# Patient Record
Sex: Female | Born: 1970 | Race: White | Hispanic: No | Smoking: Current every day smoker
Health system: Southern US, Community
[De-identification: ages and names within clinical notes are randomized; demographics above are authoritative.]

---

## 2007-10-06 ENCOUNTER — Ambulatory Visit: Payer: Self-pay

## 2010-12-20 ENCOUNTER — Ambulatory Visit: Payer: Self-pay

## 2016-05-01 ENCOUNTER — Emergency Department: Payer: Self-pay

## 2016-05-01 ENCOUNTER — Emergency Department
Admission: EM | Admit: 2016-05-01 | Discharge: 2016-05-01 | Disposition: A | Discharge status: Home / Self Care | Payer: Self-pay | Attending: Student | Admitting: Student

## 2016-05-01 DIAGNOSIS — Y9302 Activity, running: Secondary | ICD-10-CM | POA: Insufficient documentation

## 2016-05-01 DIAGNOSIS — W228XXA Striking against or struck by other objects, initial encounter: Secondary | ICD-10-CM | POA: Insufficient documentation

## 2016-05-01 DIAGNOSIS — S92511A Displaced fracture of proximal phalanx of right lesser toe(s), initial encounter for closed fracture: Secondary | ICD-10-CM | POA: Insufficient documentation

## 2016-05-01 DIAGNOSIS — F17210 Nicotine dependence, cigarettes, uncomplicated: Secondary | ICD-10-CM | POA: Insufficient documentation

## 2016-05-01 DIAGNOSIS — Y999 Unspecified external cause status: Secondary | ICD-10-CM | POA: Insufficient documentation

## 2016-05-01 DIAGNOSIS — Y929 Unspecified place or not applicable: Secondary | ICD-10-CM | POA: Insufficient documentation

## 2016-05-01 DIAGNOSIS — S92911A Unspecified fracture of right toe(s), initial encounter for closed fracture: Secondary | ICD-10-CM

## 2016-05-01 MED ORDER — HYDROCODONE-ACETAMINOPHEN 5-325 MG PO TABS: 1.0000 | ORAL_TABLET | Freq: Four times a day (QID) | ORAL | Status: DC | PRN

## 2016-05-01 NOTE — ED Provider Notes (Signed)
[facility] Emergency Department Provider Note  ____________________________________________  Time seen: Approximately 10:18 AM  I have reviewed the triage vital signs and the nursing notes.   HISTORY  Chief Complaint Toe Pain    HPI Sydney Elliott is a 45 y.o. female , NAD, presents to emergency department with one-day history of right fifth toe pain. States that she stubbed her toe and had immediate pain, swelling and bruising after the incident. States she had injuries in the past in which she believes she fractured toes, buddy tape them at home and did not seek medical attention but this injury felt different.Has been walking on the inside of her foot to decrease weightbearing on the lateral portion as that decreases her pain. Has had no ankle or lower leg pain. Denies any open wounds or lacerations. Did not have a fall due to the incident. Denies numbness, weakness, tingling.   History reviewed. No pertinent past medical history.  There are no active problems to display for this patient.   History reviewed. No pertinent past surgical history.  Current Outpatient Rx  Name  Route  Sig  Dispense  Refill  . HYDROcodone-acetaminophen (NORCO) 5-325 MG tablet   Oral   Take 1 tablet by mouth every 6 (six) hours as needed for severe pain.   12 tablet   0     Allergies Review of patient's allergies indicates no known allergies.  No family history on file.  Social History Social History  Substance Use Topics  . Smoking status: Current Every Day Smoker    Types: Cigarettes  . Smokeless tobacco: None  . Alcohol Use: No     Review of Systems  Constitutional: No fatigue Cardiovascular: No chest pain. Respiratory: No shortness of breath.  Musculoskeletal: Positive right fifth toe pain. No pain about right lower leg nor ankle. Skin: Positive bruising and swelling about the base of right fifth toe. Negative for rash, redness, warmth, open wounds  or lacerations. Neurological: Negative for headaches, focal weakness or numbness. No tingling 10-point ROS otherwise negative.  ____________________________________________   PHYSICAL EXAM:  VITAL SIGNS: ED Triage Vitals  Enc Vitals Group     BP 05/01/16 0852 117/86 mmHg     Pulse Rate 05/01/16 0852 83     Resp 05/01/16 0852 18     Temp 05/01/16 0852 97.5 F (36.4 C)     Temp Source 05/01/16 0852 Oral     SpO2 05/01/16 0852 99 %     Weight 05/01/16 0852 130 lb (58.968 kg)     Height 05/01/16 0852 5\' 3"  (1.6 m)     Head Cir --      Peak Flow --      Pain Score 05/01/16 0853 5     Pain Loc --      Pain Edu? --      Excl. in GC? --      Constitutional: Alert and oriented. Well appearing and in no acute distress. Eyes: Conjunctivae are normal.  Head: Atraumatic. Cardiovascular: Normal rate, regular rhythm. Normal S1 and S2.  Good peripheral circulation with 2+ pulses noted in the right lower extremity. Capillary refill is brisk in all digits of the right foot. Respiratory: Normal respiratory effort without tachypnea or retractions.  Musculoskeletal: Diffuse tenderness to palpation about the right fifth toe and distal portion of the right fifth metatarsal. Full range of motion of all toes about right foot except pain with flexion of the right fifth toe. Full range of  motion of the right ankle without pain. No lower extremity tenderness nor edema.  No joint effusions. Neurologic:  Normal speech and language. No gross focal neurologic deficits are appreciated. Sensation to light touch grossly intact about the right lower extremity. Skin:  Diffuse blue ecchymosis noted about the base of the right fifth toe. No open wounds or lacerations. Skin is warm, dry and intact. No rash noted. Psychiatric: Mood and affect are normal. Speech and behavior are normal. Patient exhibits appropriate insight and judgement.   ____________________________________________    LABS  None ____________________________________________  EKG  None ____________________________________________  RADIOLOGY I have personally viewed and evaluated these images (plain radiographs) as part of my medical decision making, as well as reviewing the written report by the radiologist.  Dg Toe 5th Right  05/01/2016  CLINICAL DATA:  Stubbed fifth toe yesterday with persistent pain and swelling, initial encounter EXAM: RIGHT FIFTH TOE COMPARISON:  None. FINDINGS: Oblique fracture is noted through the fifth proximal phalanx with mild impaction at the fracture site. Soft tissue swelling is noted as well. IMPRESSION: Fifth proximal phalangeal fracture with associated soft tissue swelling Electronically Signed   By: Alcide Clever M.D.   On: 05/01/2016 09:37    ____________________________________________    PROCEDURES  Procedure(s) performed: None    Medications - No data to display   ____________________________________________   INITIAL IMPRESSION / ASSESSMENT AND PLAN / ED COURSE  Pertinent imaging results that were available during my care of the patient were reviewed by me and considered in my medical decision making (see chart for details).  Patient's diagnosis is consistent with right fifth toe fracture. Patient was placed in a flat bottom cast shoe as well as right fourth and fifth toes buddy taped for comfort care. Patient will be discharged home with prescriptions for Norco to take as needed for severe pain. Patient is to keep the right lower extremity elevated when not ambulating. May apply ice 20 minutes 3-4 times daily as needed. Patient is to follow up with Dr. Ernest Pine in orthopedics in 3 days for further evaluation and treatment of fracture. Patient is given ED precautions to return to the ED for any worsening or new symptoms.     ____________________________________________  FINAL CLINICAL IMPRESSION(S) / ED DIAGNOSES  Final diagnoses:  Toe fracture,  right, closed, initial encounter      NEW MEDICATIONS STARTED DURING THIS VISIT:  Discharge Medication List as of 05/01/2016 10:21 AM    START taking these medications   Details  HYDROcodone-acetaminophen (NORCO) 5-325 MG tablet Take 1 tablet by mouth every 6 (six) hours as needed for severe pain., Starting 05/01/2016, Until Discontinued, Print             Hope Pigeon, PA-C 05/01/16 1047  Gayla Doss, MD 05/01/16 1555
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
## 2016-05-01 NOTE — Discharge Instructions (Signed)
Toe Fracture °A toe fracture is a break in one of the toe bones (phalanges). °CAUSES °This condition may be caused by: °· Dropping a heavy object on your toe. °· Stubbing your toe. °· Overusing your toe or doing repetitive exercise. °· Twisting or stretching your toe out of place. °RISK FACTORS °This condition is more likely to develop in people who: °· Play contact sports. °· Have a bone disease. °· Have a low calcium level. °SYMPTOMS °The main symptoms of this condition are swelling and pain in the toe. The pain may get worse with standing or walking. Other symptoms include: °· Bruising. °· Stiffness. °· Numbness. °· A change in the way the toe looks. °· Broken bones that poke through the skin. °· Blood beneath the toenail. °DIAGNOSIS °This condition is diagnosed with a physical exam. You may also have X-rays. °TREATMENT  °Treatment for this condition depends on the type of fracture and its severity. Treatment may involve: °· Taping the broken toe to a toe that is next to it (buddy taping). This is the most common treatment for fractures in which the bone has not moved out of place (nondisplaced fracture). °· Wearing a shoe that has a wide, rigid sole to protect the toe and to limit its movement. °· Wearing a walking cast. °· Having a procedure to move the toe back into place. °· Surgery. This may be needed: °¨ If there are many pieces of broken bone that are out of place (displaced). °¨ If the toe joint breaks. °¨ If the bone breaks through the skin. °· Physical therapy. This is done to help regain movement and strength in the toe. °You may need follow-up X-rays to make sure that the bone is healing well and staying in position. °HOME CARE INSTRUCTIONS °If You Have a Cast: °· Do not stick anything inside the cast to scratch your skin. Doing that increases your risk of infection. °· Check the skin around the cast every day. Report any concerns to your health care provider. You may put lotion on dry skin around the  edges of the cast. Do not apply lotion to the skin underneath the cast. °· Do not put pressure on any part of the cast until it is fully hardened. This may take several hours. °· Keep the cast clean and dry. °Bathing °· Do not take baths, swim, or use a hot tub until your health care provider approves. Ask your health care provider if you can take showers. You may only be allowed to take sponge baths for bathing. °· If your health care provider approves bathing and showering, cover the cast or bandage (dressing) with a watertight plastic bag to protect it from water. Do not let the cast or dressing get wet. °Managing Pain, Stiffness, and Swelling °· If you do not have a cast, apply ice to the injured area, if directed. °¨ Put ice in a plastic bag. °¨ Place a towel between your skin and the bag. °¨ Leave the ice on for 20 minutes, 2-3 times per day. °· Move your toes often to avoid stiffness and to lessen swelling. °· Raise (elevate) the injured area above the level of your heart while you are sitting or lying down. °Driving °· Do not drive or operate heavy machinery while taking pain medicine. °· Do not drive while wearing a cast on a foot that you use for driving. °Activity °· Return to your normal activities as directed by your health care provider. Ask your health care   provider what activities are safe for you. °· Perform exercises daily as directed by your health care provider or physical therapist. °Safety °· Do not use the injured limb to support your body weight until your health care provider says that you can. Use crutches or other assistive devices as directed by your health care provider. °General Instructions °· If your toe was treated with buddy taping, follow your health care provider's instructions for changing the gauze and tape. Change it more often: °¨ The gauze and tape get wet. If this happens, dry the space between the toes. °¨ The gauze and tape are too tight and cause your toe to become pale  or numb. °· Wear a protective shoe as directed by your health care provider. If you were not given a protective shoe, wear sturdy, supportive shoes. Your shoes should not pinch your toes and should not fit tightly against your toes. °· Do not use any tobacco products, including cigarettes, chewing tobacco, or e-cigarettes. Tobacco can delay bone healing. If you need help quitting, ask your health care provider. °· Take medicines only as directed by your health care provider. °· Keep all follow-up visits as directed by your health care provider. This is important. °SEEK MEDICAL CARE IF: °· You have a fever. °· Your pain medicine is not helping. °· Your toe is cold. °· Your toe is numb. °· You still have pain after one week of rest and treatment. °· You still have pain after your health care provider has said that you can start walking again. °· You have pain, tingling, or numbness in your foot that is not going away. °SEEK IMMEDIATE MEDICAL CARE IF: °· You have severe pain. °· You have redness or inflammation in your toe that is getting worse. °· You have pain or numbness in your toe that is getting worse. °· Your toe turns blue. °  °This information is not intended to replace advice given to you by your health care provider. Make sure you discuss any questions you have with your health care provider. °  °Document Released: 10/04/2000 Document Revised: 06/28/2015 Document Reviewed: 08/03/2014 °Elsevier Interactive Patient Education ©2016 [employer] ° °
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
## 2017-09-26 ENCOUNTER — Emergency Department
Admission: EM | Admit: 2017-09-26 | Discharge: 2017-09-26 | Disposition: A | Discharge status: Home / Self Care | Payer: No Typology Code available for payment source | Attending: Emergency Medicine | Admitting: Emergency Medicine

## 2017-09-26 ENCOUNTER — Emergency Department: Payer: No Typology Code available for payment source

## 2017-09-26 DIAGNOSIS — R0782 Intercostal pain: Secondary | ICD-10-CM | POA: Diagnosis present

## 2017-09-26 DIAGNOSIS — F17210 Nicotine dependence, cigarettes, uncomplicated: Secondary | ICD-10-CM | POA: Insufficient documentation

## 2017-09-26 MED ORDER — MELOXICAM 7.5 MG PO TABS: 7.5000 mg | ORAL_TABLET | Freq: Every day | ORAL | 1 refills | Status: AC

## 2017-09-26 MED ORDER — ORPHENADRINE CITRATE 30 MG/ML IJ SOLN
60.0000 mg | Freq: Two times a day (BID) | INTRAMUSCULAR | Status: DC
  Administered 2017-09-26: 60 mg via INTRAMUSCULAR
  Filled 2017-09-26: qty 2

## 2017-09-26 MED ORDER — KETOROLAC TROMETHAMINE 30 MG/ML IJ SOLN
30.0000 mg | Freq: Once | INTRAMUSCULAR | Status: AC
  Administered 2017-09-26: 30 mg via INTRAMUSCULAR
  Filled 2017-09-26: qty 1

## 2017-09-26 MED ORDER — CYCLOBENZAPRINE HCL 5 MG PO TABS: 5.0000 mg | ORAL_TABLET | Freq: Three times a day (TID) | ORAL | 0 refills | Status: AC | PRN
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
## 2018-05-29 ENCOUNTER — Other Ambulatory Visit: Payer: Self-pay

## 2018-05-29 ENCOUNTER — Emergency Department: Payer: Self-pay

## 2018-05-29 ENCOUNTER — Encounter: Payer: Self-pay | Admitting: *Deleted

## 2018-05-29 DIAGNOSIS — T594X1A Toxic effect of chlorine gas, accidental (unintentional), initial encounter: Secondary | ICD-10-CM | POA: Insufficient documentation

## 2018-05-29 DIAGNOSIS — R0602 Shortness of breath: Secondary | ICD-10-CM | POA: Insufficient documentation

## 2018-05-29 DIAGNOSIS — F17210 Nicotine dependence, cigarettes, uncomplicated: Secondary | ICD-10-CM | POA: Insufficient documentation

## 2018-05-29 DIAGNOSIS — R062 Wheezing: Secondary | ICD-10-CM | POA: Insufficient documentation
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
## 2018-05-30 ENCOUNTER — Emergency Department
Admission: EM | Admit: 2018-05-30 | Discharge: 2018-05-30 | Disposition: A | Discharge status: Home / Self Care | Payer: Self-pay | Attending: Emergency Medicine | Admitting: Emergency Medicine

## 2018-05-30 DIAGNOSIS — T1490XA Injury, unspecified, initial encounter: Secondary | ICD-10-CM

## 2018-05-30 MED ORDER — DEXAMETHASONE 4 MG PO TABS
12.0000 mg | ORAL_TABLET | Freq: Once | ORAL | Status: AC
  Administered 2018-05-30: 12 mg via ORAL
  Filled 2018-05-30: qty 3

## 2018-05-30 MED ORDER — IPRATROPIUM-ALBUTEROL 0.5-2.5 (3) MG/3ML IN SOLN
3.0000 mL | Freq: Once | RESPIRATORY_TRACT | Status: AC
  Administered 2018-05-30: 3 mL via RESPIRATORY_TRACT
  Filled 2018-05-30: qty 3
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
## 2019-11-24 ENCOUNTER — Ambulatory Visit: Payer: Self-pay | Attending: Internal Medicine

## 2019-11-24 DIAGNOSIS — Z20822 Contact with and (suspected) exposure to covid-19: Secondary | ICD-10-CM | POA: Insufficient documentation

## 2019-11-25 ENCOUNTER — Telehealth: Payer: Self-pay | Admitting: General Practice

## 2019-11-25 LAB — NOVEL CORONAVIRUS, NAA: SARS-CoV-2, NAA: NOT DETECTED

## 2019-11-25 NOTE — Telephone Encounter (Signed)
Negative COVID results given. Patient results "NOT Detected." Caller expressed understanding. ° °

## 2022-03-31 ENCOUNTER — Encounter: Payer: Self-pay | Admitting: Emergency Medicine

## 2022-03-31 ENCOUNTER — Other Ambulatory Visit: Payer: Self-pay

## 2022-03-31 ENCOUNTER — Emergency Department
Admission: EM | Admit: 2022-03-31 | Discharge: 2022-03-31 | Disposition: A | Discharge status: Home / Self Care | Payer: Self-pay | Attending: Emergency Medicine | Admitting: Emergency Medicine

## 2022-03-31 DIAGNOSIS — L089 Local infection of the skin and subcutaneous tissue, unspecified: Secondary | ICD-10-CM | POA: Insufficient documentation

## 2022-03-31 DIAGNOSIS — B9689 Other specified bacterial agents as the cause of diseases classified elsewhere: Secondary | ICD-10-CM

## 2022-03-31 MED ORDER — PREDNISONE 10 MG PO TABS: ORAL_TABLET | ORAL | 0 refills | Status: AC

## 2022-03-31 MED ORDER — DEXAMETHASONE SODIUM PHOSPHATE 10 MG/ML IJ SOLN
10.0000 mg | Freq: Once | INTRAMUSCULAR | Status: AC
  Administered 2022-03-31: 10 mg via INTRAMUSCULAR
  Filled 2022-03-31: qty 1

## 2022-03-31 MED ORDER — CEPHALEXIN 500 MG PO CAPS: 500.0000 mg | ORAL_CAPSULE | Freq: Four times a day (QID) | ORAL | 0 refills | Status: AC

## 2022-03-31 NOTE — ED Provider Notes (Signed)
[facility] Provider Note    None    (approximate)   History   Insect Bite   HPI  Sydney Elliott is a 51 y.o. female   presents to the ED with complaint of right ear externally burning, swelling and erythematous.  Patient states she was working out in the yard and could have been bit by an insect or a spider.  Today she woke up with her ear more swollen and some drainage from the skin.  Patient also complains of itching frequently.  She denies any fever or chills.  Patient denies any medical history other than being a smoker.      Physical Exam   Triage Vital Signs: ED Triage Vitals  Enc Vitals Group     BP 03/31/22 0921 117/85     Pulse Rate 03/31/22 0921 97     Resp 03/31/22 0921 20     Temp 03/31/22 0921 98.2 F (36.8 C)     Temp Source 03/31/22 0921 Oral     SpO2 03/31/22 0921 95 %     Weight 03/31/22 0920 130 lb 1.1 oz (59 kg)     Height 03/31/22 0920 5\' 8"  (1.727 m)     Head Circumference --      Peak Flow --      Pain Score 03/31/22 0920 5     Pain Loc --      Pain Edu? --      Excl. in [place]? --     Most recent vital signs: Vitals:   03/31/22 0921  BP: 117/85  Pulse: 97  Resp: 20  Temp: 98.2 F (36.8 C)  SpO2: 95%     General: Awake, no distress.  CV:  Good peripheral perfusion.  Resp:  Normal effort.  Lungs are clear bilaterally. Abd:  No distention.  Other:  Right ear externally is erythematous, swollen and weeping on the anterior portion.  No localized bite or abrasion is noted.  No drainage noted from the canal.  No point tenderness on palpation.   ED Results / Procedures / Treatments   Labs (all labs ordered are listed, but only abnormal results are displayed) Labs Reviewed - No data to display     PROCEDURES:  Critical Care performed:   Procedures   MEDICATIONS ORDERED IN ED: Medications  dexamethasone (DECADRON) injection 10 mg (10 mg Intramuscular Given 03/31/22 1001)     IMPRESSION / MDM /  [place] / ED COURSE  I reviewed the triage vital signs and the nursing notes.   Differential diagnosis includes, but is not limited to, insect bite, spider bite, cellulitis right ear externally.  51 year old female presents to the ED with complaint of ear pain and possible insect or spider bite that occurred yesterday.  Patient states she woke this morning and her ear was swollen, itching and painful.  She does not actually know what bit her but she did feel a slight stinging while she was doing yard work.  Area is swollen, erythematous and weeping suspicious for an infected insect bite.  Patient was given Decadron 10 mg IM while in the ED to help with itching and she is in encouraged to take Benadryl every 6 hours as needed.  Also a tapering dose of prednisone and Keflex 500 mg 4 times daily for 7 days.  Patient is to follow-up with her PCP if any continued problems or return to the emergency department if any severe worsening of her symptoms.  Patient's presentation is most consistent with acute, uncomplicated illness.  FINAL CLINICAL IMPRESSION(S) / ED DIAGNOSES   Final diagnoses:  Skin infection, bacterial     Rx / DC Orders   ED Discharge Orders          Ordered    cephALEXin (KEFLEX) 500 MG capsule  4 times daily        03/31/22 1000    predniSONE (DELTASONE) 10 MG tablet        03/31/22 1000             Note:  This document was prepared using Dragon voice recognition software and may include unintentional dictation errors.   Johnn Hai, PA-C 03/31/22 1024    Delman Kitten, MD 03/31/22 1159

## 2022-03-31 NOTE — ED Triage Notes (Signed)
Pt reports thinks something bit her right ear. Pt states yesterday started with a burning pain and redness to her right ear that has gotten worse today.

## 2022-03-31 NOTE — Discharge Instructions (Signed)
Follow-up with your primary care provider if any continued problems or concerns.  Begin taking the medication sent to the pharmacy for infection and also for inflammation and swelling.  You may also take Benadryl 1 or 2 capsules every 6 hours as needed for itching.  If any severe worsening of your symptoms, fever, chills, nausea or vomiting return to the emergency department for reevaluation.

## 2022-03-31 NOTE — ED Notes (Signed)
See triage note  presents with possible insect bite to right ear  states she felt something yesterday  brushed it off  woke up with redness,swelling and some drainage to right ear this am afebrile on arrival

## 2023-03-11 ENCOUNTER — Other Ambulatory Visit: Payer: Self-pay

## 2023-03-11 ENCOUNTER — Encounter: Payer: Self-pay | Admitting: Family Medicine

## 2023-03-11 DIAGNOSIS — Z1231 Encounter for screening mammogram for malignant neoplasm of breast: Secondary | ICD-10-CM

## 2023-04-28 ENCOUNTER — Ambulatory Visit: Payer: Self-pay | Attending: Hematology and Oncology

## 2023-04-28 ENCOUNTER — Telehealth: Payer: Self-pay

## 2023-04-28 ENCOUNTER — Telehealth: Payer: Self-pay | Admitting: Hematology and Oncology

## 2023-04-28 NOTE — Telephone Encounter (Signed)
I have attempted to reach the pt regarding her 2pm appt today. I was not able to leave a message as her voicemail box is full. I have attempted to reach the pt twice.
